# Patient Record
Sex: Male | Born: 1955
Health system: Southern US, Community
[De-identification: ages and names within clinical notes are randomized; demographics above are authoritative.]

## PROBLEM LIST (undated history)

## (undated) DIAGNOSIS — E669 Obesity, unspecified: Secondary | ICD-10-CM

## (undated) DIAGNOSIS — E1169 Type 2 diabetes mellitus with other specified complication: Secondary | ICD-10-CM

## (undated) DIAGNOSIS — E7800 Pure hypercholesterolemia, unspecified: Secondary | ICD-10-CM

## (undated) HISTORY — DX: Obesity, unspecified: E66.9

## (undated) HISTORY — DX: Pure hypercholesterolemia, unspecified: E78.00

## (undated) HISTORY — DX: Type 2 diabetes mellitus with other specified complication: E11.69

---

## 2007-08-19 ENCOUNTER — Ambulatory Visit (HOSPITAL_COMMUNITY): Payer: Self-pay | Admitting: Psychiatry

## 2007-08-23 ENCOUNTER — Ambulatory Visit (HOSPITAL_COMMUNITY): Payer: Self-pay | Admitting: Psychology

## 2007-09-03 ENCOUNTER — Ambulatory Visit (HOSPITAL_COMMUNITY): Payer: Self-pay | Admitting: Psychiatry

## 2007-09-04 ENCOUNTER — Ambulatory Visit (HOSPITAL_COMMUNITY): Payer: Self-pay | Admitting: Psychiatry

## 2007-09-18 ENCOUNTER — Ambulatory Visit (HOSPITAL_COMMUNITY): Payer: Self-pay | Admitting: Psychiatry

## 2007-10-09 ENCOUNTER — Ambulatory Visit (HOSPITAL_COMMUNITY): Payer: Self-pay | Admitting: Psychiatry

## 2007-10-15 ENCOUNTER — Ambulatory Visit (HOSPITAL_COMMUNITY): Payer: Self-pay | Admitting: Psychiatry

## 2007-10-29 ENCOUNTER — Ambulatory Visit (HOSPITAL_COMMUNITY): Payer: Self-pay | Admitting: Psychiatry

## 2007-11-12 ENCOUNTER — Ambulatory Visit (HOSPITAL_COMMUNITY): Payer: Self-pay | Admitting: Psychiatry

## 2007-11-26 ENCOUNTER — Ambulatory Visit (HOSPITAL_COMMUNITY): Payer: Self-pay | Admitting: Psychiatry

## 2007-12-17 ENCOUNTER — Ambulatory Visit (HOSPITAL_COMMUNITY): Payer: Self-pay | Admitting: Psychiatry

## 2007-12-30 ENCOUNTER — Ambulatory Visit (HOSPITAL_COMMUNITY): Payer: Self-pay | Admitting: Psychiatry

## 2008-01-13 ENCOUNTER — Ambulatory Visit (HOSPITAL_COMMUNITY): Payer: Self-pay | Admitting: Psychiatry

## 2008-01-16 ENCOUNTER — Ambulatory Visit (HOSPITAL_COMMUNITY): Payer: Self-pay | Admitting: Psychiatry

## 2008-01-28 ENCOUNTER — Ambulatory Visit (HOSPITAL_COMMUNITY): Payer: Self-pay | Admitting: Psychiatry

## 2008-02-13 ENCOUNTER — Ambulatory Visit (HOSPITAL_COMMUNITY): Payer: Self-pay | Admitting: Psychiatry

## 2008-02-26 ENCOUNTER — Ambulatory Visit (HOSPITAL_COMMUNITY): Payer: Self-pay | Admitting: Psychiatry

## 2008-03-11 ENCOUNTER — Ambulatory Visit (HOSPITAL_COMMUNITY): Payer: Self-pay | Admitting: Psychiatry

## 2008-03-25 ENCOUNTER — Ambulatory Visit (HOSPITAL_COMMUNITY): Payer: Self-pay | Admitting: Psychology

## 2008-04-08 ENCOUNTER — Ambulatory Visit (HOSPITAL_COMMUNITY): Payer: Self-pay | Admitting: Psychiatry

## 2008-04-09 ENCOUNTER — Encounter: Admission: RE | Admit: 2008-04-09 | Discharge: 2008-04-09 | Payer: Self-pay | Admitting: Sports Medicine

## 2008-04-22 ENCOUNTER — Ambulatory Visit (HOSPITAL_COMMUNITY): Payer: Self-pay | Admitting: Psychology

## 2008-05-06 ENCOUNTER — Ambulatory Visit (HOSPITAL_COMMUNITY): Payer: Self-pay | Admitting: Psychology

## 2008-05-14 ENCOUNTER — Ambulatory Visit (HOSPITAL_COMMUNITY): Payer: Self-pay | Admitting: Psychiatry

## 2008-07-15 ENCOUNTER — Ambulatory Visit (HOSPITAL_COMMUNITY): Payer: Self-pay | Admitting: Psychology

## 2008-07-29 ENCOUNTER — Ambulatory Visit (HOSPITAL_COMMUNITY): Payer: Self-pay | Admitting: Psychology

## 2008-08-25 ENCOUNTER — Ambulatory Visit (HOSPITAL_COMMUNITY): Payer: Self-pay | Admitting: Psychiatry

## 2008-09-02 ENCOUNTER — Ambulatory Visit (HOSPITAL_COMMUNITY): Payer: Self-pay | Admitting: Psychology

## 2008-09-16 ENCOUNTER — Ambulatory Visit (HOSPITAL_COMMUNITY): Payer: Self-pay | Admitting: Psychology

## 2008-09-22 ENCOUNTER — Ambulatory Visit: Payer: Self-pay | Admitting: Family Medicine

## 2008-09-22 DIAGNOSIS — M1710 Unilateral primary osteoarthritis, unspecified knee: Secondary | ICD-10-CM

## 2008-09-22 DIAGNOSIS — N200 Calculus of kidney: Secondary | ICD-10-CM

## 2008-09-22 DIAGNOSIS — K5730 Diverticulosis of large intestine without perforation or abscess without bleeding: Secondary | ICD-10-CM | POA: Insufficient documentation

## 2008-09-22 DIAGNOSIS — I10 Essential (primary) hypertension: Secondary | ICD-10-CM

## 2008-09-22 DIAGNOSIS — H00019 Hordeolum externum unspecified eye, unspecified eyelid: Secondary | ICD-10-CM | POA: Insufficient documentation

## 2008-09-22 DIAGNOSIS — E785 Hyperlipidemia, unspecified: Secondary | ICD-10-CM

## 2008-09-22 DIAGNOSIS — Z87898 Personal history of other specified conditions: Secondary | ICD-10-CM

## 2008-09-22 DIAGNOSIS — E119 Type 2 diabetes mellitus without complications: Secondary | ICD-10-CM

## 2008-09-22 DIAGNOSIS — M5137 Other intervertebral disc degeneration, lumbosacral region: Secondary | ICD-10-CM

## 2008-09-22 LAB — CONVERTED CEMR LAB: Hgb A1c MFr Bld: 7.9 %

## 2008-09-24 ENCOUNTER — Encounter: Admission: RE | Admit: 2008-09-24 | Discharge: 2008-09-24 | Payer: Self-pay | Admitting: Sports Medicine

## 2008-09-25 ENCOUNTER — Encounter: Payer: Self-pay | Admitting: Family Medicine

## 2008-09-25 LAB — CONVERTED CEMR LAB
ALT: 42 units/L
AST: 26 units/L
Albumin: 4.8 g/dL
Alkaline Phosphatase: 70 units/L
Calcium: 10.2 mg/dL
Glucose, Bld: 213 mg/dL
HCT: 44.7 %
Hemoglobin: 14.7 g/dL
PSA: 0.7 ng/mL
Platelets: 254 10*3/uL
Sodium: 134 meq/L
Total Bilirubin: 0.37 mg/dL
Triglycerides: 307 mg/dL
WBC, blood: 9.1 10*3/uL

## 2008-09-30 ENCOUNTER — Ambulatory Visit (HOSPITAL_COMMUNITY): Payer: Self-pay | Admitting: Psychology

## 2008-10-02 ENCOUNTER — Encounter: Payer: Self-pay | Admitting: Family Medicine

## 2008-10-05 ENCOUNTER — Encounter: Payer: Self-pay | Admitting: Family Medicine

## 2008-10-06 ENCOUNTER — Encounter: Admission: RE | Admit: 2008-10-06 | Discharge: 2008-11-13 | Payer: Self-pay | Admitting: Sports Medicine

## 2008-11-03 ENCOUNTER — Ambulatory Visit: Payer: Self-pay | Admitting: Family Medicine

## 2008-11-03 LAB — CONVERTED CEMR LAB: Blood Glucose, Fasting: 203 mg/dL

## 2008-11-24 ENCOUNTER — Ambulatory Visit (HOSPITAL_COMMUNITY): Payer: Self-pay | Admitting: Psychiatry

## 2008-12-29 ENCOUNTER — Telehealth (INDEPENDENT_AMBULATORY_CARE_PROVIDER_SITE_OTHER): Payer: Self-pay | Admitting: *Deleted

## 2008-12-31 ENCOUNTER — Encounter: Admission: RE | Admit: 2008-12-31 | Discharge: 2008-12-31 | Payer: Self-pay | Admitting: Sports Medicine

## 2009-02-18 ENCOUNTER — Ambulatory Visit (HOSPITAL_COMMUNITY): Payer: Self-pay | Admitting: Psychiatry

## 2009-03-11 ENCOUNTER — Ambulatory Visit (HOSPITAL_COMMUNITY): Payer: Self-pay | Admitting: Psychology

## 2009-03-27 ENCOUNTER — Encounter: Admission: RE | Admit: 2009-03-27 | Discharge: 2009-03-27 | Payer: Self-pay | Admitting: Sports Medicine

## 2009-05-27 ENCOUNTER — Ambulatory Visit (HOSPITAL_COMMUNITY): Payer: Self-pay | Admitting: Psychiatry

## 2009-08-26 ENCOUNTER — Ambulatory Visit (HOSPITAL_COMMUNITY): Payer: Self-pay | Admitting: Psychiatry

## 2010-04-07 ENCOUNTER — Ambulatory Visit (HOSPITAL_COMMUNITY): Payer: Self-pay | Admitting: Psychiatry

## 2010-07-06 ENCOUNTER — Ambulatory Visit (HOSPITAL_COMMUNITY): Payer: Self-pay | Admitting: Psychiatry

## 2010-08-19 ENCOUNTER — Encounter
Admission: RE | Admit: 2010-08-19 | Discharge: 2010-09-29 | Payer: Self-pay | Source: Home / Self Care | Admitting: Physical Medicine and Rehabilitation

## 2010-10-05 ENCOUNTER — Ambulatory Visit (HOSPITAL_COMMUNITY): Payer: Self-pay | Admitting: Psychiatry

## 2010-10-06 ENCOUNTER — Ambulatory Visit: Payer: Self-pay | Admitting: Diagnostic Radiology

## 2010-10-06 ENCOUNTER — Encounter: Payer: Self-pay | Admitting: Emergency Medicine

## 2010-10-06 ENCOUNTER — Inpatient Hospital Stay (HOSPITAL_COMMUNITY): Admission: EM | Admit: 2010-10-06 | Discharge: 2010-10-09 | Payer: Self-pay

## 2011-02-17 ENCOUNTER — Ambulatory Visit: Payer: Self-pay | Admitting: Physical Therapy

## 2011-02-21 ENCOUNTER — Ambulatory Visit: Payer: BC Managed Care – PPO | Attending: Rehabilitation | Admitting: Physical Therapy

## 2011-02-21 DIAGNOSIS — IMO0001 Reserved for inherently not codable concepts without codable children: Secondary | ICD-10-CM | POA: Insufficient documentation

## 2011-02-21 DIAGNOSIS — M542 Cervicalgia: Secondary | ICD-10-CM | POA: Insufficient documentation

## 2011-02-21 DIAGNOSIS — M25519 Pain in unspecified shoulder: Secondary | ICD-10-CM | POA: Insufficient documentation

## 2011-02-22 LAB — CBC
HCT: 36.5 % — ABNORMAL LOW (ref 39.0–52.0)
HCT: 38 % — ABNORMAL LOW (ref 39.0–52.0)
Hemoglobin: 13.4 g/dL (ref 13.0–17.0)
MCH: 29.8 pg (ref 26.0–34.0)
MCHC: 33.4 g/dL (ref 30.0–36.0)
MCHC: 35.1 g/dL (ref 30.0–36.0)
MCV: 84.3 fL (ref 78.0–100.0)
MCV: 84.7 fL (ref 78.0–100.0)
Platelets: 215 10*3/uL (ref 150–400)
RBC: 4.49 MIL/uL (ref 4.22–5.81)
RDW: 13.7 % (ref 11.5–15.5)

## 2011-02-22 LAB — DIFFERENTIAL
Basophils Relative: 0 % (ref 0–1)
Eosinophils Relative: 1 % (ref 0–5)
Lymphs Abs: 1.2 10*3/uL (ref 0.7–4.0)
Monocytes Absolute: 1.2 10*3/uL — ABNORMAL HIGH (ref 0.1–1.0)
Neutro Abs: 14.5 10*3/uL — ABNORMAL HIGH (ref 1.7–7.7)

## 2011-02-22 LAB — GLUCOSE, CAPILLARY
Glucose-Capillary: 246 mg/dL — ABNORMAL HIGH (ref 70–99)
Glucose-Capillary: 255 mg/dL — ABNORMAL HIGH (ref 70–99)

## 2011-02-22 LAB — BASIC METABOLIC PANEL
BUN: 10 mg/dL (ref 6–23)
BUN: 14 mg/dL (ref 6–23)
CO2: 28 mEq/L (ref 19–32)
CO2: 26 mEq/L (ref 19–32)
Calcium: 9.4 mg/dL (ref 8.4–10.5)
Chloride: 99 mEq/L (ref 96–112)
Chloride: 102 mEq/L (ref 96–112)
Creatinine, Ser: 0.82 mg/dL (ref 0.4–1.5)
Creatinine, Ser: 0.97 mg/dL (ref 0.4–1.5)
GFR calc Af Amer: >60 mL/min (ref >60)
GFR calc Af Amer: >60 mL/min (ref >60)
GFR calc non Af Amer: >60 mL/min (ref >60)
Glucose, Bld: 205 mg/dL — ABNORMAL HIGH (ref 70–99)
Glucose, Bld: 210 mg/dL — ABNORMAL HIGH (ref 70–99)
Potassium: 4.9 mEq/L (ref 3.5–5.1)
Sodium: 140 mEq/L (ref 135–145)

## 2011-02-22 LAB — MRSA PCR SCREENING: MRSA by PCR: NEGATIVE

## 2011-02-23 ENCOUNTER — Ambulatory Visit: Payer: BC Managed Care – PPO | Admitting: Physical Therapy

## 2011-02-27 ENCOUNTER — Ambulatory Visit: Payer: BC Managed Care – PPO | Admitting: Physical Therapy

## 2011-03-03 ENCOUNTER — Encounter: Payer: Self-pay | Admitting: Physical Therapy

## 2011-03-07 ENCOUNTER — Ambulatory Visit: Payer: BC Managed Care – PPO | Admitting: Physical Therapy

## 2011-03-10 ENCOUNTER — Ambulatory Visit: Payer: BC Managed Care – PPO | Admitting: Physical Therapy

## 2011-03-14 ENCOUNTER — Ambulatory Visit: Payer: BC Managed Care – PPO | Attending: Rehabilitation | Admitting: Physical Therapy

## 2011-03-14 DIAGNOSIS — M542 Cervicalgia: Secondary | ICD-10-CM | POA: Insufficient documentation

## 2011-03-14 DIAGNOSIS — IMO0001 Reserved for inherently not codable concepts without codable children: Secondary | ICD-10-CM | POA: Insufficient documentation

## 2011-03-14 DIAGNOSIS — M25519 Pain in unspecified shoulder: Secondary | ICD-10-CM | POA: Insufficient documentation

## 2011-03-20 ENCOUNTER — Ambulatory Visit: Payer: BC Managed Care – PPO | Admitting: Physical Therapy

## 2011-03-22 ENCOUNTER — Ambulatory Visit: Payer: BC Managed Care – PPO | Admitting: Physical Therapy

## 2011-03-27 ENCOUNTER — Encounter (INDEPENDENT_AMBULATORY_CARE_PROVIDER_SITE_OTHER): Payer: BC Managed Care – PPO | Admitting: Psychiatry

## 2011-03-27 DIAGNOSIS — F319 Bipolar disorder, unspecified: Secondary | ICD-10-CM

## 2011-08-28 ENCOUNTER — Ambulatory Visit (INDEPENDENT_AMBULATORY_CARE_PROVIDER_SITE_OTHER): Payer: BC Managed Care – PPO | Admitting: Psychology

## 2011-08-28 ENCOUNTER — Encounter (HOSPITAL_COMMUNITY): Payer: Self-pay

## 2011-08-28 DIAGNOSIS — F3160 Bipolar disorder, current episode mixed, unspecified: Secondary | ICD-10-CM

## 2011-09-11 ENCOUNTER — Encounter (INDEPENDENT_AMBULATORY_CARE_PROVIDER_SITE_OTHER): Payer: BC Managed Care – PPO | Admitting: Psychology

## 2011-09-11 DIAGNOSIS — F319 Bipolar disorder, unspecified: Secondary | ICD-10-CM

## 2011-09-21 ENCOUNTER — Encounter (INDEPENDENT_AMBULATORY_CARE_PROVIDER_SITE_OTHER): Payer: BC Managed Care – PPO | Admitting: Psychiatry

## 2011-09-21 DIAGNOSIS — F319 Bipolar disorder, unspecified: Secondary | ICD-10-CM

## 2011-09-22 ENCOUNTER — Encounter (HOSPITAL_COMMUNITY): Payer: BC Managed Care – PPO | Admitting: Psychiatry

## 2011-09-25 ENCOUNTER — Encounter (INDEPENDENT_AMBULATORY_CARE_PROVIDER_SITE_OTHER): Payer: BC Managed Care – PPO | Admitting: Psychology

## 2011-09-25 DIAGNOSIS — F319 Bipolar disorder, unspecified: Secondary | ICD-10-CM

## 2011-09-26 ENCOUNTER — Encounter (HOSPITAL_COMMUNITY): Payer: BC Managed Care – PPO | Admitting: Psychiatry

## 2011-10-23 ENCOUNTER — Other Ambulatory Visit (HOSPITAL_COMMUNITY): Payer: Self-pay | Admitting: Psychiatry

## 2011-10-23 DIAGNOSIS — F3132 Bipolar disorder, current episode depressed, moderate: Secondary | ICD-10-CM

## 2012-01-15 ENCOUNTER — Other Ambulatory Visit (HOSPITAL_COMMUNITY): Payer: Self-pay | Admitting: Psychiatry

## 2012-03-20 ENCOUNTER — Encounter (HOSPITAL_COMMUNITY): Payer: Self-pay

## 2012-03-21 ENCOUNTER — Ambulatory Visit (HOSPITAL_COMMUNITY): Payer: BC Managed Care – PPO | Admitting: Psychiatry

## 2012-04-16 ENCOUNTER — Other Ambulatory Visit (HOSPITAL_COMMUNITY): Payer: Self-pay | Admitting: Psychiatry

## 2012-04-23 ENCOUNTER — Ambulatory Visit (INDEPENDENT_AMBULATORY_CARE_PROVIDER_SITE_OTHER): Payer: BC Managed Care – PPO | Admitting: Psychiatry

## 2012-04-23 ENCOUNTER — Encounter (HOSPITAL_COMMUNITY): Payer: Self-pay | Admitting: Psychiatry

## 2012-04-23 VITALS — BP 138/82 | Ht 70.0 in | Wt 234.0 lb

## 2012-04-23 DIAGNOSIS — F314 Bipolar disorder, current episode depressed, severe, without psychotic features: Secondary | ICD-10-CM | POA: Insufficient documentation

## 2012-04-23 DIAGNOSIS — F319 Bipolar disorder, unspecified: Secondary | ICD-10-CM

## 2012-04-23 DIAGNOSIS — F3130 Bipolar disorder, current episode depressed, mild or moderate severity, unspecified: Secondary | ICD-10-CM

## 2012-04-23 NOTE — Progress Notes (Signed)
   [facility] Follow-up Outpatient Visit  Adain Geurin Vallance [birth year]   Subjective: The patient is a 56 year old male who has been followed by [facility] since September of 2008. He is currently diagnosed with bipolar disorder. I have not seen him since October of 2012. He mentioned his most recent appointment. At his last appointment, the patient has been laid off of work. He reports that he was a little depressed because of it. He's been off work since January. He has 32 weeks of severance. He is considering moving to [place]. He is jobhunting every day. He did not have his knee replacement. His primary care physician encouraged him not to. He did piroxicam for a while, but is off it now. His knee is starting to her again. The patient does report chronic pain which keeps him awake at night sometimes. He feels that his mood has been stable. There has been no manic episodes. He still has issues with his wife and her clutter. There no suicidal thoughts. No hallucinations. Patient is doing well.  Filed Vitals:   04/23/12 1428  BP: 138/82    Mental Status Examination  Appearance: Casual Alert: Yes Attention: good  Cooperative: Yes Eye Contact: Good Speech: Regular rate rhythm and volume Psychomotor Activity: Normal Memory/Concentration: Intact Oriented: person, place, time/date and situation Mood: Euthymic Affect: Appropriate Thought Processes and Associations: Logical Fund of Knowledge: Fair Thought Content: No suicidal or homicidal thoughts Insight: Fair Judgement: Fair  Diagnosis: Bipolar disorder, most recent episode depressed  Treatment Plan: The patient continues to do well on current medications. I will see him back in 6 months. Patient moved to [facility], he is to notify me. Patient call with concerns.  Jamse Mead, MD

## 2012-06-27 ENCOUNTER — Other Ambulatory Visit (HOSPITAL_COMMUNITY): Payer: Self-pay | Admitting: Psychiatry

## 2012-06-28 ENCOUNTER — Telehealth (HOSPITAL_COMMUNITY): Payer: Self-pay | Admitting: Psychiatry

## 2012-06-28 NOTE — Telephone Encounter (Signed)
Pharmacy sent in fax for request on refill of sertraline. Called pharmacy. Patient has refills of this medication.

## 2012-07-08 ENCOUNTER — Other Ambulatory Visit (HOSPITAL_COMMUNITY): Payer: Self-pay | Admitting: Psychiatry

## 2012-08-11 ENCOUNTER — Other Ambulatory Visit (HOSPITAL_COMMUNITY): Payer: Self-pay | Admitting: Psychiatry

## 2012-08-29 ENCOUNTER — Other Ambulatory Visit: Payer: Self-pay | Admitting: Obstetrics & Gynecology

## 2012-08-29 DIAGNOSIS — Z1231 Encounter for screening mammogram for malignant neoplasm of breast: Secondary | ICD-10-CM

## 2012-09-10 ENCOUNTER — Ambulatory Visit: Payer: Self-pay
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
## 2012-10-10 ENCOUNTER — Ambulatory Visit: Payer: PRIVATE HEALTH INSURANCE | Attending: Family Medicine | Admitting: Physical Therapy

## 2012-10-10 DIAGNOSIS — M25519 Pain in unspecified shoulder: Secondary | ICD-10-CM | POA: Insufficient documentation

## 2012-10-10 DIAGNOSIS — IMO0001 Reserved for inherently not codable concepts without codable children: Secondary | ICD-10-CM | POA: Insufficient documentation

## 2012-10-15 ENCOUNTER — Ambulatory Visit: Payer: PRIVATE HEALTH INSURANCE | Attending: Family Medicine | Admitting: Physical Therapy

## 2012-10-15 DIAGNOSIS — IMO0001 Reserved for inherently not codable concepts without codable children: Secondary | ICD-10-CM | POA: Insufficient documentation

## 2012-10-15 DIAGNOSIS — M25519 Pain in unspecified shoulder: Secondary | ICD-10-CM | POA: Insufficient documentation

## 2012-10-17 ENCOUNTER — Ambulatory Visit: Payer: PRIVATE HEALTH INSURANCE | Admitting: Physical Therapy

## 2012-10-21 ENCOUNTER — Ambulatory Visit: Payer: PRIVATE HEALTH INSURANCE | Admitting: Physical Therapy

## 2012-10-24 ENCOUNTER — Ambulatory Visit (INDEPENDENT_AMBULATORY_CARE_PROVIDER_SITE_OTHER): Payer: PRIVATE HEALTH INSURANCE | Admitting: Psychiatry

## 2012-10-24 ENCOUNTER — Ambulatory Visit: Payer: PRIVATE HEALTH INSURANCE | Admitting: Physical Therapy

## 2012-10-24 ENCOUNTER — Encounter (HOSPITAL_COMMUNITY): Payer: Self-pay | Admitting: Psychiatry

## 2012-10-24 VITALS — BP 120/78 | Ht 70.0 in | Wt 226.0 lb

## 2012-10-24 DIAGNOSIS — F3130 Bipolar disorder, current episode depressed, mild or moderate severity, unspecified: Secondary | ICD-10-CM

## 2012-10-24 DIAGNOSIS — F319 Bipolar disorder, unspecified: Secondary | ICD-10-CM
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
## 2012-10-29 ENCOUNTER — Ambulatory Visit: Payer: PRIVATE HEALTH INSURANCE | Admitting: Physical Therapy

## 2012-10-31 ENCOUNTER — Encounter: Payer: Self-pay | Admitting: Physical Therapy

## 2012-12-29 ENCOUNTER — Other Ambulatory Visit (HOSPITAL_COMMUNITY): Payer: Self-pay | Admitting: Psychiatry

## 2013-03-11 ENCOUNTER — Telehealth (HOSPITAL_COMMUNITY): Payer: Self-pay

## 2013-03-11 NOTE — Telephone Encounter (Signed)
Completed.

## 2013-03-11 NOTE — Telephone Encounter (Signed)
PT IS GOING BACK TO SCHOOL AND GETTING FINANCIAL ASSISTANCE THROUGH DEPARTMENT OF [employer] [facility] AND NEEDS A LETTER STATING HIS PSYCHIATRIC CONDITION, DIAGNOSIS AND TREATMENT PROGRAM, PT ALSO STATES HE WILL BE GOING TO PCP AND WANTED [place] TO SEND ORDER THERE FOR HIS LITHIUM LEVEL TO BE DRAWN [phone] FAX TO DR. BADGER

## 2013-04-02 ENCOUNTER — Other Ambulatory Visit (HOSPITAL_COMMUNITY): Payer: Self-pay | Admitting: Psychiatry

## 2013-04-23 ENCOUNTER — Ambulatory Visit (INDEPENDENT_AMBULATORY_CARE_PROVIDER_SITE_OTHER): Payer: PRIVATE HEALTH INSURANCE | Admitting: Psychiatry

## 2013-04-23 ENCOUNTER — Encounter (HOSPITAL_COMMUNITY): Payer: Self-pay | Admitting: Psychiatry

## 2013-04-23 VITALS — BP 140/90 | Ht 70.0 in | Wt 230.0 lb

## 2013-04-23 DIAGNOSIS — F319 Bipolar disorder, unspecified: Secondary | ICD-10-CM

## 2013-04-23 DIAGNOSIS — F3130 Bipolar disorder, current episode depressed, mild or moderate severity, unspecified: Secondary | ICD-10-CM

## 2013-04-23 MED ORDER — TRAZODONE HCL 50 MG PO TABS: ORAL_TABLET | ORAL | Status: DC

## 2013-04-23 NOTE — Progress Notes (Signed)
[facility] Follow-up Outpatient Visit  Caleb Barnett 1956-09-24   Subjective: The patient is a 57 year old male who has been followed by [facility] since September of 2008. He is currently diagnosed with bipolar disorder. At his last appointment, I continued his lithium, Ativan, and Zoloft. He presents today. He had called in the interim looking for a letter from vocational rehabilitation. He did not use a letter, but was denied assistance because his wife's income is too much. The patient is still employed. He feels that he could get a job in [place], but he does not want to move back here. He and his wife's issues over her hoarding continue. His wife is in denial. The patient stays busy with projects. He has been Architect. The patient did not get his lithium level. When I suggested one today, he reports he occasionally forgets his morning lithium. He also tells me he's been off Zoloft for a year. He was afraid that it would affect a life insurance. He also felt he did not need it. The patient will be starting school in the fall. He will be attending [employer] in getting a BS. 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 Because of this, he feels he needs to get his weight and blood sugars under control. He denies any racing thoughts. He is having issues sleeping at night. He does not want to go up on the Ativan, nor do I want him to. He feels that his mood has been stable.  Filed Vitals:   04/23/13 1221  BP: 140/90   Active Ambulatory Problems    Diagnosis Date Noted  . DIABETES MELLITUS, CONTROLLED 09/22/2008  . HYPERLIPIDEMIA 09/22/2008  . BIPOLAR DISORDER UNSPECIFIED 09/22/2008  . STYE 09/22/2008  . ESSENTIAL HYPERTENSION, BENIGN 09/22/2008  . DIVERTICULOSIS OF COLON 09/22/2008  . NEPHROLITHIASIS 09/22/2008  . DEGENERATIVE JOINT DISEASE, KNEES, BILATERAL 09/22/2008  . 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 BENIGN PROSTATIC HYPERTROPHY, HX  OF 09/22/2008  . Bipolar 1 disorder 04/23/2012   Resolved Ambulatory Problems    Diagnosis Date Noted  . No Resolved Ambulatory Problems   No Additional Past Medical History   Current Outpatient Prescriptions on File Prior to Visit  Medication Sig Dispense Refill  . glipiZIDE (GLUCOTROL) 5 MG tablet       . lithium 300 MG tablet take 2 and 1/2 tablets by mouth twice a day  150 tablet  5  . LORazepam (ATIVAN) 0.5 MG tablet take 1 to 2 tablets by mouth at bedtime if needed  60 tablet  5  . meloxicam (MOBIC) 15 MG tablet       . metFORMIN (GLUCOPHAGE) 850 MG tablet       . sertraline (ZOLOFT) 50 MG tablet take 1 tablet by mouth once daily  30 tablet  PRN  . simvastatin (ZOCOR) 40 MG tablet        No current facility-administered medications on file prior to visit.   Review of Systems - General ROS: negative for - sleep disturbance or weight loss Psychological ROS: negative for - irritability or mood swings Cardiovascular ROS: no chest pain or dyspnea on exertion Musculoskeletal ROS: negative for - gait disturbance or muscular weakness Neurological ROS: negative for - dizziness, headaches or seizures  Mental Status Examination  Appearance: Casual Alert: Yes Attention: good  Cooperative: Yes Eye Contact: Good Speech: Regular rate rhythm and volume Psychomotor Activity: Normal Memory/Concentration: Intact Oriented: person, place, time/date and situation Mood: Euthymic  Affect: Appropriate Thought Processes and Associations: Logical Fund of Knowledge: Fair Thought Content: No suicidal or homicidal thoughts Insight: Fair Judgement: Fair  Diagnosis: Bipolar disorder, most recent episode depressed  Treatment Plan: I will start trazodone to help with sleep. Risks, benefits, and side effects discussed. I will continue lithium and Ativan. Patient is no longer on Zoloft. I will see him back in one month. He may call with concerns. Jamse Mead, MD

## 2013-05-23 ENCOUNTER — Ambulatory Visit (INDEPENDENT_AMBULATORY_CARE_PROVIDER_SITE_OTHER): Payer: PRIVATE HEALTH INSURANCE | Admitting: Psychiatry

## 2013-05-23 ENCOUNTER — Encounter (HOSPITAL_COMMUNITY): Payer: Self-pay | Admitting: Psychiatry

## 2013-05-23 VITALS — BP 122/78 | Ht 70.0 in | Wt 222.0 lb

## 2013-05-23 DIAGNOSIS — F3130 Bipolar disorder, current episode depressed, mild or moderate severity, unspecified: Secondary | ICD-10-CM

## 2013-05-23 DIAGNOSIS — F319 Bipolar disorder, unspecified: Secondary | ICD-10-CM
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
## 2013-06-22 ENCOUNTER — Other Ambulatory Visit (HOSPITAL_COMMUNITY): Payer: Self-pay | Admitting: Psychiatry

## 2013-06-24 ENCOUNTER — Other Ambulatory Visit (HOSPITAL_COMMUNITY): Payer: Self-pay | Admitting: *Deleted

## 2013-06-24 ENCOUNTER — Telehealth (HOSPITAL_COMMUNITY): Payer: Self-pay

## 2013-06-24 MED ORDER — LORAZEPAM 0.5 MG PO TABS: ORAL_TABLET | ORAL | Status: DC

## 2013-06-27 NOTE — Telephone Encounter (Signed)
Script was called in to the pharmacy.  

## 2013-08-17 ENCOUNTER — Other Ambulatory Visit (HOSPITAL_COMMUNITY): Payer: Self-pay | Admitting: Psychiatry

## 2013-08-22 ENCOUNTER — Ambulatory Visit (INDEPENDENT_AMBULATORY_CARE_PROVIDER_SITE_OTHER): Payer: PRIVATE HEALTH INSURANCE | Admitting: Psychiatry

## 2013-08-22 ENCOUNTER — Encounter (HOSPITAL_COMMUNITY): Payer: Self-pay | Admitting: Psychiatry

## 2013-08-22 VITALS — BP 118/78 | Ht 70.0 in | Wt 218.0 lb

## 2013-08-22 DIAGNOSIS — F3130 Bipolar disorder, current episode depressed, mild or moderate severity, unspecified: Secondary | ICD-10-CM

## 2013-08-22 DIAGNOSIS — F3132 Bipolar disorder, current episode depressed, moderate: Secondary | ICD-10-CM

## 2013-08-22 NOTE — Progress Notes (Signed)
[facility] Follow-up Outpatient Visit  Caleb Barnett 1956/09/21   Subjective: The patient is a 57 year old male who has been followed by [facility] since September of 2008. He is currently diagnosed with bipolar disorder. At his last appointment, I continued lithium, Trazodone and Ativan. The patient presents today. He has started school at [employer]. It is a 4 year program. He is taking 13 hours this semester. He is only 4 weeks then. He is very disappointed with how poorly he performed when he attended college in the past. He graduated with a 2.2. He's hoping to do much better now. His wife is no longer ambivalent and is actually proud of him being in school. She does continue to work. We discussed ordering versus disorganization versus potential ADHD type symptoms. The patient is sleeping much better with the trazodone. Appetite is good. He is getting exercise. He is down 4 pounds today. Mood has been stable. There has been no manic episodes. The patient cannot remember when his last lithium level was obtained.    Filed Vitals:   08/22/13 1033  BP: 118/78   Active Ambulatory Problems    Diagnosis Date Noted  . DIABETES MELLITUS, CONTROLLED 09/22/2008  . HYPERLIPIDEMIA 09/22/2008  . BIPOLAR DISORDER UNSPECIFIED 09/22/2008  . STYE 09/22/2008  . ESSENTIAL HYPERTENSION, BENIGN 09/22/2008  . DIVERTICULOSIS OF COLON 09/22/2008  . NEPHROLITHIASIS 09/22/2008  . DEGENERATIVE JOINT DISEASE, KNEES, BILATERAL 09/22/2008  . 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 BENIGN PROSTATIC HYPERTROPHY, HX OF 09/22/2008  . Bipolar 1 disorder 04/23/2012   Resolved Ambulatory Problems    Diagnosis Date Noted  . No Resolved Ambulatory Problems   No Additional Past Medical History   Current Outpatient Prescriptions on File Prior to Visit  Medication Sig Dispense Refill  . fluticasone (FLONASE) 50 MCG/ACT nasal spray       . glipiZIDE (GLUCOTROL) 5 MG tablet        . lithium 300 MG tablet take 2 and 1/2 tablets by mouth twice a day  150 tablet  5  . LORazepam (ATIVAN) 0.5 MG tablet take 1 to 2 tablets by mouth at bedtime if needed  60 tablet  5  . losartan (COZAAR) 50 MG tablet       . meloxicam (MOBIC) 15 MG tablet       . metFORMIN (GLUCOPHAGE) 850 MG tablet       . montelukast (SINGULAIR) 10 MG tablet       . simvastatin (ZOCOR) 40 MG tablet       . traZODone (DESYREL) 50 MG tablet Take 1 to 2 at bedtime as needed  60 tablet  2   No current facility-administered medications on file prior to visit.   Review of Systems - General ROS: negative for - sleep disturbance or weight loss Psychological ROS: negative for - irritability or mood swings Cardiovascular ROS: no chest pain or dyspnea on exertion Musculoskeletal ROS: negative for - gait disturbance or muscular weakness Neurological ROS: negative for - dizziness, headaches or seizures  Mental Status Examination  Appearance: Casual Alert: Yes Attention: good  Cooperative: Yes Eye Contact: Good Speech: Regular rate rhythm and volume Psychomotor Activity: Normal Memory/Concentration: Intact Oriented: person, place, time/date and situation Mood: Euthymic Affect: Appropriate Thought Processes and Associations: Logical Fund of Knowledge: Fair Thought Content: No suicidal or homicidal thoughts Insight: Fair Judgement: Fair  Diagnosis: Bipolar disorder, most recent episode depressed  Treatment Plan: I will order a lithium level for  next week. Patient aware to get level drawn prior to taking morning medication. I will continue lithium, Ativan, and trazodone. I'll see the patient back in 6 months. Patient may call with concerns. Jamse Mead, MD

## 2013-09-12 ENCOUNTER — Telehealth (HOSPITAL_COMMUNITY): Payer: Self-pay

## 2013-09-12 NOTE — Telephone Encounter (Signed)
Returned call.  Asking for advise.  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  Told him too verbose.  Pt did report bipolar.  Tone it down for 5 weeks.

## 2013-09-15 ENCOUNTER — Ambulatory Visit (HOSPITAL_COMMUNITY): Payer: Self-pay | Admitting: Psychiatry

## 2013-10-12 ENCOUNTER — Other Ambulatory Visit (HOSPITAL_COMMUNITY): Payer: Self-pay | Admitting: Psychiatry

## 2013-11-25 ENCOUNTER — Ambulatory Visit (INDEPENDENT_AMBULATORY_CARE_PROVIDER_SITE_OTHER): Payer: PRIVATE HEALTH INSURANCE | Admitting: Psychiatry

## 2013-11-25 ENCOUNTER — Encounter (INDEPENDENT_AMBULATORY_CARE_PROVIDER_SITE_OTHER): Payer: Self-pay

## 2013-11-25 ENCOUNTER — Encounter (HOSPITAL_COMMUNITY): Payer: Self-pay | Admitting: Psychiatry

## 2013-11-25 VITALS — BP 148/91 | HR 95 | Wt 228.0 lb

## 2013-11-25 DIAGNOSIS — F314 Bipolar disorder, current episode depressed, severe, without psychotic features: Secondary | ICD-10-CM

## 2013-11-25 MED ORDER — LORAZEPAM 0.5 MG PO TABS: 0.5000 mg | ORAL_TABLET | Freq: Every evening | ORAL | Status: DC | PRN

## 2013-11-25 MED ORDER — TRAZODONE HCL 50 MG PO TABS: ORAL_TABLET | ORAL | Status: DC

## 2013-11-25 NOTE — Progress Notes (Signed)
[facility] [id number] Progress Note  Caleb Barnett [id number] 57 y.o.  11/25/2013 3:27 PM  Chief Complaint:  HPI Comments: Mr. Hollibaugh is  a 57 y/o male with a past psychiatric history significant for Bipolar I Disorder. The patient is referred for psychiatric services for psychiatric evaluation and medication management.    . Location: The patient reports he is doing relatively well.  . Quality: The patient reports he has decreased his lithium by 300 mg daily for a total of 1200 mg daily.  He denies any worsening of mood or suicidal thoughts.  In the area of affective symptoms, patient appears euthymic. Patient denies current suicidal ideation, intent, or plan. Patient denies current homicidal ideation, intent, or plan. Patient denies auditory hallucinations. Patient denies visual hallucinations. Patient denies symptoms of paranoia. Patient states sleep is poor. Appetite is good. Energy level is good. Patient denies symptoms of anhedonia. Patient denies hopelessness, helplessness, or guilt.   Denies any recent episodes consistent with mania, particularly decreased need for sleep with increased energy, grandiosity, impulsivity, hyperverbal and pressured speech, or increased productivity.   . Severity: Depression: 7/10 (0=Very depressed; 5=Neutral; 10=Very Happy)  Anxiety- 5/10 (0=no anxiety; 5= moderate/tolerable anxiety; 10= panic attacks)  . Duration: Since 1987.  . Timing: No specific timing  . Context: Class exams.  . Modifying factors: No specific factor.  . Associated signs and symptoms: Denies any  recent symptoms consistent with psychosis, particularly auditory or visual hallucinations, thought broadcasting/insertion/withdrawal, or ideas of reference. Also denies excessive worry to the point of physical symptoms as well as any panic attacks. Denies any history of trauma or symptoms consistent with PTSD such as flashbacks, nightmares, hypervigilance, feelings of  numbness or inability to connect with others.    History of Present Illness: Suicidal Ideation: Negative Plan Formed: Negative Patient has means to carry out plan: Negative  Homicidal Ideation: Negative Plan Formed: Negative Patient has means to carry out plan: Negative  Review of Systems: Psychiatric: Agitation: Yes Hallucination: No Depressed Mood: No Insomnia: Yes Hypersomnia: No Altered Concentration: No Feels Worthless: No Grandiose Ideas: No Belief In Special Powers: No New/Increased Substance Abuse: No Compulsions: No  Neurologic: Headache: No Seizure: Yes-seizure Paresthesias: No  Past Medical Family, Social History:  Family History  Problem Relation Age of Onset  . Dementia Mother   . Depression Sister   . Bipolar disorder Neg Hx   . Alcohol abuse Neg Hx   . Drug abuse Neg Hx   . Anxiety disorder Neg Hx   . Schizophrenia Neg Hx   . OCD Sister    History   Social History  . Marital Status: Married    Spouse Name: N/A    Number of Children: N/A  . Years of Education: N/A   Occupational History  . Not on file.   Social History Main Topics  . Smoking status: Never Smoker   . Smokeless tobacco: Not on file  . Alcohol Use: 0.5 oz/week    1 drink(s) per week  . Drug Use: No  . Sexual Activity: No   Other Topics Concern  . Not on file   Social History Narrative  . No narrative on file      Outpatient Encounter Prescriptions as of 11/25/2013  Medication Sig  . fluticasone (FLONASE) 50 MCG/ACT nasal spray   . glipiZIDE (GLUCOTROL) 5 MG tablet   . lithium 300 MG tablet take 2 and 1/2 tablets by mouth twice a day  . LORazepam (ATIVAN) 0.5 MG tablet  take 1 to 2 tablets by mouth at bedtime if needed  . losartan (COZAAR) 50 MG tablet   . meloxicam (MOBIC) 15 MG tablet   . metFORMIN (GLUCOPHAGE) 850 MG tablet   . montelukast (SINGULAIR) 10 MG tablet   . simvastatin (ZOCOR) 40 MG tablet   . traZODone (DESYREL) 50 MG tablet Take 1 to 2 at bedtime  as needed    Past Psychiatric History/Hospitalization(s): Anxiety: No Bipolar Disorder: Yes Depression: Yes Mania: Yes Psychosis: No Schizophrenia: No Personality Disorder: No Hospitalization for psychiatric illness: Yes History of Electroconvulsive Shock Therapy: No Prior Suicide Attempts: No  Physical Exam: Constitutional:  BP 148/91  Pulse 95  Wt 228 lb (103.42 kg)  General Appearance: alert, oriented, no acute distress and obese  Musculoskeletal: Strength & Muscle Tone: within normal limits Gait & Station: normal Patient leans: N/A  Psychiatric: General Appearance: Casual and Fairly Groomed  Patent attorney::  Good  Speech:  Clear and Coherent and Normal Rate  Volume:  Increased  Mood:  "Good"  Affect:  Appropriate, Congruent and Full Range  Thought Process:  Coherent, Linear and Logical  Orientation:  Full (Time, Place, and Person)  Thought Content:  WDL  Suicidal Thoughts:  No  Homicidal Thoughts:  No  Memory:  Immediate;   Good Recent;   Fair Remote;   Poor  Judgement:  Fair  Insight:  Fair  Psychomotor Activity:  Normal  Concentration:  Fair  Recall:  Fair  Akathisia:  No  Handed:  Right  AIMS (if indicated):  Not indicated  Assets:  Communication Skills Desire for Improvement Financial Resources/Insurance Intimacy Leisure Time Physical Health Resilience Social Support Talents/Skills Transportation  Sleep:  Number of Hours:      Assessment: Axis I: Bipolar I disorder, most recent episode (or current) depressed, severe, without mention of psychotic behavior    Plan:   Plan of Care:  PLAN:  1. Affirm with the patient that the medications are taken as ordered. Patient  expressed understanding of how their medications were to be used.    Laboratory:  No labs warranted at this time.    Psychotherapy: Therapy: brief supportive therapy provided.  Discussed psychosocial stressors in detail.    Medications:  Continue  the following  psychiatric medications as written prior to this appointment  the following changes::  a) traZODone (DESYREL) 50 MG tablet-Take 1 to 2 at bedtime as needed-Advised patient to use this medication for insomnia. b) Patient has decreased lithium 300 MG tablet-take 2 tablets by mouth twice a day c) Decrease LORazepam (ATIVAN) 0.5 MG tablet-take 1 tablets by mouth at bedtime if needed-will taper patient off this medication.  -Risks and benefits, side effects and alternatives discussed with patient, he was given an opportunity to ask questions about his medication, illness, and treatment. All current psychiatric medications have been reviewed and discussed with the patient and adjusted as clinically appropriate. The patient has been provided an accurate and updated list of the medications being now prescribed.   Routine PRN Medications:  Negative  Consultations: The patient was encouraged to keep all PCP and specialty clinic appointments.   Safety Concerns:   Patient told to call clinic if any problems occur. Patient advised to go to  ER  if he should develop SI/HI, side effects, or if symptoms worsen. Has crisis numbers to call if needed.    Other:   8. Patient was instructed to return to clinic in 6 weeks.  9. The patient was advised to call  and cancel their mental health appointment within 24 hours of the appointment, if they are unable to keep the appointment, as well as the three no show and termination from clinic policy. 10. The patient expressed understanding of the plan and agrees with the above.  Time Spent: 38 minutes Jacqulyn Cane, M.D.  11/25/2013 3:28 PM

## 2013-12-01 NOTE — Addendum Note (Signed)
Addended by: Larena Sox on: 12/01/2013 04:05 PM   Modules accepted: Level of Service

## 2014-01-06 ENCOUNTER — Ambulatory Visit (HOSPITAL_COMMUNITY): Payer: Self-pay | Admitting: Psychiatry

## 2014-01-15 ENCOUNTER — Encounter (INDEPENDENT_AMBULATORY_CARE_PROVIDER_SITE_OTHER): Payer: Self-pay

## 2014-01-15 ENCOUNTER — Ambulatory Visit (INDEPENDENT_AMBULATORY_CARE_PROVIDER_SITE_OTHER): Payer: PRIVATE HEALTH INSURANCE | Admitting: Psychiatry

## 2014-01-15 ENCOUNTER — Encounter (HOSPITAL_COMMUNITY): Payer: Self-pay | Admitting: Psychiatry

## 2014-01-15 VITALS — BP 132/77 | HR 89 | Wt 228.0 lb

## 2014-01-15 DIAGNOSIS — F314 Bipolar disorder, current episode depressed, severe, without psychotic features: Secondary | ICD-10-CM

## 2014-01-15 MED ORDER — SERTRALINE HCL 50 MG PO TABS: 50.0000 mg | ORAL_TABLET | Freq: Every day | ORAL | Status: AC

## 2014-01-15 MED ORDER — LITHIUM CARBONATE 300 MG PO TABS: ORAL_TABLET | ORAL | Status: DC

## 2014-01-15 MED ORDER — TRAZODONE HCL 50 MG PO TABS: ORAL_TABLET | ORAL | Status: DC

## 2014-01-15 NOTE — Progress Notes (Signed)
[facility] Follow-up Outpatient Visit  Caleb Barnett 08-04-1956 01/15/2014 2:45 PM  Chief Complaint:  HPI Comments: Mr. Caleb Barnett is  a 58 y/o male with a past psychiatric history significant for Bipolar I Disorder. The patient is referred for psychiatric services for  medication management.    . Location: The patient reports he has been doing well with trazodone and a decreased dose of benzodiazepines. . Quality: The patient reports he is still taking lithium by 300 mg daily for a total of 1200 mg daily.  He denies any worsening of mood or suicidal thoughts. He reports he is taking his medications and denies and side effects. .  In the area of affective symptoms, patient appears euthymic. Patient denies current suicidal ideation, intent, or plan. Patient denies current homicidal ideation, intent, or plan. Patient denies auditory hallucinations. Patient denies visual hallucinations. Patient denies symptoms of paranoia. Patient states sleep improved with a total of . Appetite is increased. Energy level is good. Patient denies symptoms of anhedonia. Patient denies hopelessness, helplessness, or guilt.   Denies any recent episodes consistent with mania, particularly decreased need for sleep with increased energy, grandiosity, impulsivity, hyperverbal and pressured speech, or increased productivity.   . Severity: Depression: 5/10 (0=Very depressed; 5=Neutral; 10=Very Happy)  Anxiety- 8/10 (0=no anxiety; 5= moderate/tolerable anxiety; 10= panic attacks)-for class exams.  . Duration: Since 1987.  . Timing: No specific timing  . Context: Class exams.  . Modifying factors: No reported modifying factors.  . Associated signs and symptoms: Denies any  recent symptoms consistent with psychosis, particularly auditory or visual hallucinations, thought broadcasting/insertion/withdrawal, or ideas of reference. Also denies excessive worry to the point of physical symptoms as well as  any panic attacks. Denies any history of trauma or symptoms consistent with PTSD such as flashbacks, nightmares, hypervigilance, feelings of numbness or inability to connect with others.    History of Present Illness: Suicidal Ideation: Negative Plan Formed: Negative Patient has means to carry out plan: Negative  Homicidal Ideation: Negative Plan Formed: Negative Patient has means to carry out plan: Negative  Review of Systems: Psychiatric: Agitation: Yes Hallucination: No Depressed Mood: No Insomnia: Yes Hypersomnia: No Altered Concentration: No Feels Worthless: No Grandiose Ideas: No Belief In Special Powers: No New/Increased Substance Abuse: No Compulsions: No  Neurologic: Headache: No Seizure: Yes-seizure Paresthesias: No  Past Medical Family, Social History:  Family History  Problem Relation Age of Onset  . Dementia Mother   . Depression Sister   . Bipolar disorder Neg Hx   . Alcohol abuse Neg Hx   . Drug abuse Neg Hx   . Anxiety disorder Neg Hx   . Schizophrenia Neg Hx   . OCD Sister    History   Social History  . Marital Status: Married    Spouse Name: N/A    Number of Children: N/A  . Years of Education: N/A   Occupational History  . Not on file.   Social History Main Topics  . Smoking status: Never Smoker   . Smokeless tobacco: Not on file  . Alcohol Use: 0.5 oz/week    1 drink(s) per week  . Drug Use: No  . Sexual Activity: No   Other Topics Concern  . Not on file   Social History Narrative  . No narrative on file      Outpatient Encounter Prescriptions as of 01/15/2014  Medication Sig  . dimenhyDRINATE (DRAMAMINE) 50 MG tablet Take 50 mg by mouth every 8 (eight) hours as  needed.  . fluticasone (FLONASE) 50 MCG/ACT nasal spray   . glipiZIDE (GLUCOTROL) 5 MG tablet   . lithium 300 MG tablet take 2  tablets by mouth twice a day  . LORazepam (ATIVAN) 0.5 MG tablet Take 1 tablet (0.5 mg total) by mouth at bedtime as needed for anxiety.  Marland Kitchen  losartan (COZAAR) 50 MG tablet   . meloxicam (MOBIC) 15 MG tablet   . metFORMIN (GLUCOPHAGE) 850 MG tablet   . montelukast (SINGULAIR) 10 MG tablet   . simvastatin (ZOCOR) 40 MG tablet   . traZODone (DESYREL) 50 MG tablet Take 1 to 2 at bedtime as needed    Past Psychiatric History/Hospitalization(s): Anxiety: No Bipolar Disorder: Yes Depression: Yes Mania: Yes Psychosis: No Schizophrenia: No Personality Disorder: No Hospitalization for psychiatric illness: Yes History of Electroconvulsive Shock Therapy: No Prior Suicide Attempts: No  Physical Exam: Constitutional:  BP 132/77  Pulse 89  Wt 228 lb (103.42 kg)  General Appearance: alert, oriented, no acute distress and obese  Musculoskeletal: Strength & Muscle Tone: within normal limits Gait & Station: normal Patient leans: N/A  Psychiatric: General Appearance: Casual and Fairly Groomed  Patent attorney::  Good  Speech:  Clear and Coherent and Normal Rate  Volume:  Increased  Mood:  "Good"  Affect:  Appropriate, Congruent and Full Range  Thought Process:  Coherent, Linear and Logical  Orientation:  Full (Time, Place, and Person)  Thought Content:  WDL  Suicidal Thoughts:  No  Homicidal Thoughts:  No  Memory:  Immediate;   Good Recent;   Fair Remote;   Poor  Judgement:  Fair  Insight:  Fair  Psychomotor Activity:  Normal  Concentration:  Fair  Recall:  Fair  Akathisia:  No  Handed:  Right  AIMS (if indicated):  Not indicated  Assets:  Communication Skills Desire for Improvement Financial Resources/Insurance Intimacy Leisure Time Physical Health Resilience Social Support Talents/Skills Transportation  Sleep:  Number of Hours:      Assessment: Bipolar I disorder, most recent episode (or current) depressed, severe, without mention of psychotic behavior-stable Axis I: Bipolar I disorder, most recent episode (or current) depressed, severe, without mention of psychotic behavior    Plan:   Plan of Care:   PLAN:  1. Affirm with the patient that the medications are taken as ordered. Patient  expressed understanding of how their medications were to be used.    Laboratory:  No labs warranted at this time.  Will order lithium level at next visit.  Psychotherapy: Therapy: brief supportive therapy provided.  Discussed psychosocial stressors in detail.  More than 50% of the visit was spent on individual therapy/counseling.   Medications:  Continue  the following psychiatric medications as written prior to this appointment  the following changes: a) traZODone (DESYREL) 50 MG tablet-Take 1 to 2 at bedtime as needed-Advised patient to use this medication for insomnia. b) Lithium 300 MG tablet-take 2 tablets by mouth twice a day-no change at this time. C) Decrease LORazepam (ATIVAN) 0.5 MG tablet-take 1 tablets by mouth at bedtime if needed-will taper patient off this medication. D) Sertraline-50 mg -Risks and benefits, side effects and alternatives discussed with patient, he was given an opportunity to ask questions about his medication, illness, and treatment. All current psychiatric medications have been reviewed and discussed with the patient and adjusted as clinically appropriate. The patient has been provided an accurate and updated list of the medications being now prescribed.   Routine PRN Medications:  Negative  Consultations: The  patient was encouraged to keep all PCP and specialty clinic appointments.   Safety Concerns:   Patient told to call clinic if any problems occur. Patient advised to go to  ER  if he should develop SI/HI, side effects, or if symptoms worsen. Has crisis numbers to call if needed.    Other:   8. Patient was instructed to return to clinic in 6 weeks.  9. The patient was advised to call and cancel their mental health appointment within 24 hours of the appointment, if they are unable to keep the appointment, as well as the three no show and termination from clinic policy. 10.  The patient expressed understanding of the plan and agrees with the above.  Time Spent: 38 minutes Jacqulyn Cane, M.D.  01/15/2014 2:45 PM

## 2014-02-19 ENCOUNTER — Ambulatory Visit (HOSPITAL_COMMUNITY): Payer: Self-pay | Admitting: Psychiatry

## 2014-02-26 ENCOUNTER — Ambulatory Visit (HOSPITAL_COMMUNITY): Payer: Self-pay | Admitting: Psychiatry

## 2014-03-27 ENCOUNTER — Other Ambulatory Visit (HOSPITAL_COMMUNITY): Payer: Self-pay | Admitting: Psychiatry

## 2014-03-27 DIAGNOSIS — F314 Bipolar disorder, current episode depressed, severe, without psychotic features: Secondary | ICD-10-CM

## 2014-04-01 MED ORDER — LITHIUM CARBONATE 300 MG PO TABS: ORAL_TABLET | ORAL | Status: AC

## 2014-04-28 ENCOUNTER — Other Ambulatory Visit (HOSPITAL_COMMUNITY): Payer: Self-pay | Admitting: Psychiatry

## 2014-05-01 ENCOUNTER — Ambulatory Visit (HOSPITAL_COMMUNITY): Payer: Self-pay | Admitting: Psychiatry

## 2014-05-07 ENCOUNTER — Telehealth (HOSPITAL_COMMUNITY): Payer: Self-pay | Admitting: *Deleted

## 2014-05-07 NOTE — Telephone Encounter (Signed)
Received faxed request for Trazodone 50 mg.Last appt 01/15/14 with Dr. Laury Deep.No show 02/26/14 with Dr. Laury Deep and No Show 5/22 with Dr. Alta Corning.Appt 05/19/14 with Dr. Alta Corning

## 2014-05-07 NOTE — Telephone Encounter (Signed)
The trazodone cannot be refill as the patient has had multiple no-shows. Patient needs to see Dr Lynnae Sandhoff in order to get prescription. Let patient know

## 2014-05-19 ENCOUNTER — Encounter (HOSPITAL_COMMUNITY): Payer: Self-pay | Admitting: Psychiatry

## 2014-05-19 ENCOUNTER — Ambulatory Visit (INDEPENDENT_AMBULATORY_CARE_PROVIDER_SITE_OTHER): Payer: PRIVATE HEALTH INSURANCE | Admitting: Psychiatry

## 2014-05-19 ENCOUNTER — Encounter (INDEPENDENT_AMBULATORY_CARE_PROVIDER_SITE_OTHER): Payer: Self-pay

## 2014-05-19 DIAGNOSIS — F314 Bipolar disorder, current episode depressed, severe, without psychotic features: Secondary | ICD-10-CM

## 2014-05-19 MED ORDER — TRAZODONE HCL 50 MG PO TABS: ORAL_TABLET | ORAL | Status: AC

## 2014-05-19 MED ORDER — LORAZEPAM 0.5 MG PO TABS: 0.5000 mg | ORAL_TABLET | Freq: Every evening | ORAL | Status: AC | PRN

## 2014-05-19 NOTE — Progress Notes (Signed)
Patient ID: Caleb Barnett, male   DOB: 1956/01/09, 58 y.o.   MRN: [id number]   [facility] Follow-up Outpatient Visit  Caleb Barnett 05-17-1956 05/19/2014 1:46 PM  Chief Complaint:  HPI Comments: Caleb Barnett is  a 58 y/o male with a past psychiatric history significant for Bipolar I Disorder. The patient is referred for psychiatric services for  medication management.    . Location: The patient reports he had been doing well with trazodone and a decreased dose of benzodiazepines. But apparently ran low on trazadone. Wants to follow with Dr. Christell Constant and had to come one time to get trazadone. As his sleep is disturbed, increase agitation, anxiety and some racy toughts. Concern he may go into a hypomanic episode without adequate sleep. Stopped zoloft said it wasn't of much help. . Quality: The patient reports he is still taking lithium by 300 mg daily for a total of 1200 mg daily.  He denies any worsening of mood or suicidal thoughts. He reports no side effects. .  In the area of affective symptoms, patient appears euthymic. Patient denies current suicidal ideation, intent, or plan. Patient denies current homicidal ideation, intent, or plan. Patient denies auditory hallucinations. Patient denies visual hallucinations. Patient denies symptoms of paranoia. Patient states sleep improved with a total of . Appetite is increased. Energy level is good. Patient denies symptoms of anhedonia. Patient denies hopelessness, helplessness, or guilt.   Denies any recent episodes consistent with mania, particularly decreased need for sleep with increased energy, grandiosity, impulsivity, hyperverbal and pressured speech, or increased productivity.   . Severity: Depression: 5/10 (0=Very depressed; 5=Neutral; 10=Very Happy)  Anxiety- 8/10 (0=no anxiety; 5= moderate/tolerable anxiety; 10= panic attacks)-for class exams.  . Duration: Since 1987.  . Timing: No specific timing  . Context: Class  exams.  . Modifying factors: No reported modifying factors.  . Associated signs and symptoms: Denies any  recent symptoms consistent with psychosis, particularly auditory or visual hallucinations, thought broadcasting/insertion/withdrawal, or ideas of reference. Also denies excessive worry to the point of physical symptoms as well as any panic attacks. Denies any history of trauma or symptoms consistent with PTSD such as flashbacks, nightmares, hypervigilance, feelings of numbness or inability to connect with others.    History of Present Illness: Suicidal Ideation: Negative Plan Formed: Negative Patient has means to carry out plan: Negative  Homicidal Ideation: Negative Plan Formed: Negative Patient has means to carry out plan: Negative  Review of Systems: Psychiatric: Agitation: Yes Hallucination: No Depressed Mood: No Insomnia: Yes Hypersomnia: No Altered Concentration: No Feels Worthless: No Grandiose Ideas: No Belief In Special Powers: No New/Increased Substance Abuse: No Compulsions: No  Neurologic: Headache: No Seizure: Yes-seizure Paresthesias: No  Past Medical Family, Social History:  Family History  Problem Relation Age of Onset  . Dementia Mother   . Depression Sister   . Bipolar disorder Neg Hx   . Alcohol abuse Neg Hx   . Drug abuse Neg Hx   . Anxiety disorder Neg Hx   . Schizophrenia Neg Hx   . OCD Sister    History   Social History  . Marital Status: Married    Spouse Name: N/A    Number of Children: N/A  . Years of Education: N/A   Occupational History  . Not on file.   Social History Main Topics  . Smoking status: Never Smoker   . Smokeless tobacco: Not on file  . Alcohol Use: 0.5 oz/week    1 drink(s) per week  .  Drug Use: No  . Sexual Activity: No   Other Topics Concern  . Not on file   Social History Narrative  . No narrative on file      Outpatient Encounter Prescriptions as of 05/19/2014  Medication Sig  . dimenhyDRINATE  (DRAMAMINE) 50 MG tablet Take 50 mg by mouth every 8 (eight) hours as needed.  Marland Kitchen. glipiZIDE (GLUCOTROL) 5 MG tablet   . lithium 300 MG tablet take 2  tablets by mouth twice a day  . LORazepam (ATIVAN) 0.5 MG tablet Take 1 tablet (0.5 mg total) by mouth at bedtime as needed.  Marland Kitchen. losartan (COZAAR) 50 MG tablet   . meloxicam (MOBIC) 15 MG tablet   . metFORMIN (GLUCOPHAGE) 850 MG tablet   . montelukast (SINGULAIR) 10 MG tablet   . sertraline (ZOLOFT) 50 MG tablet Take 1 tablet (50 mg total) by mouth daily.  . simvastatin (ZOCOR) 40 MG tablet   . traZODone (DESYREL) 50 MG tablet Take 1 to 2 at bedtime as needed  . [DISCONTINUED] LORazepam (ATIVAN) 0.5 MG tablet Take 1 tablet (0.5 mg total) by mouth at bedtime as needed.  . [DISCONTINUED] traZODone (DESYREL) 50 MG tablet Take 1 to 2 at bedtime as needed    Past Psychiatric History/Hospitalization(s): Anxiety: No Bipolar Disorder: Yes Depression: Yes Mania: Yes Psychosis: No Schizophrenia: No Personality Disorder: No Hospitalization for psychiatric illness: Yes History of Electroconvulsive Shock Therapy: No Prior Suicide Attempts: No  Physical Exam: Constitutional:  There were no vitals taken for this visit.  General Appearance: alert, oriented, no acute distress and obese  Musculoskeletal: Strength & Muscle Tone: within normal limits Gait & Station: normal Patient leans: N/A  Psychiatric: General Appearance: Casual and Fairly Groomed  Patent attorneyye Contact::  Good  Speech:  Clear and Coherent and Normal Rate with some circumstantiality  Volume:  Increased  Mood:  "Good"  Affect:  Appropriate, Congruent and Full Range  Thought Process:  Coherent, Linear and Logical  Orientation:  Full (Time, Place, and Person)  Thought Content:  WDL  Suicidal Thoughts:  No  Homicidal Thoughts:  No  Memory:  Immediate;   Good Recent;   Fair Remote;   Poor  Judgement:  Fair  Insight:  Fair  Psychomotor Activity:  Normal  Concentration:  Fair   Recall:  Fair  Akathisia:  No  Handed:  Right  AIMS (if indicated):  Not indicated  Assets:  Communication Skills Desire for Improvement Financial Resources/Insurance Intimacy Leisure Time Physical Health Resilience Social Support Talents/Skills Transportation  Sleep:  Number of Hours:      Assessment: Bipolar I disorder, most recent episode (or current) depressed, severe, without mention of psychotic behavior-stable Axis I: Bipolar I disorder, most recent episode (or current) depressed, severe, without mention of psychotic behavior    Plan:   Plan of Care:  PLAN:  1. Affirm with the patient that the medications are taken as ordered. Patient  expressed understanding of how their medications were to be used.    Laboratory:  No labs warranted at this time.  Will order lithium level at next visit.  Psychotherapy: Therapy: brief supportive therapy provided.  Discussed psychosocial stressors in detail.  More than 50% of the visit was spent on individual therapy/counseling.   Medications:  Continue  the following psychiatric medications as written prior to this appointment  the following changes: a) traZODone (DESYREL) 50 MG tablet-Take 1 to 2 at bedtime as needed- to prevent insomnia as hypomania trigger. b) Lithium 300 MG tablet-take 2  tablets by mouth twice a day-no change at this time. C) LORazepam (ATIVAN) 0.5 MG tablet-take 1 tablets by mouth at bedtime as he is feeling anxious without it and combined with trazadone has been a good combination. He was on higher dose before but showed understanding to lower the dose. D) stop Sertraline-50 mg. Already not taking -Risks and benefits, side effects and alternatives discussed with patient, he was given an opportunity to ask questions about his medication, illness, and treatment. All current psychiatric medications have been reviewed and discussed with the patient and adjusted as clinically appropriate. The patient has been  provided an accurate and updated list of the medications being now prescribed.   Routine PRN Medications:  Negative  Consultations: The patient was encouraged to keep all PCP and specialty clinic appointments.   Safety Concerns:   Patient told to call clinic if any problems occur. Patient advised to go to  ER  if he should develop SI/HI, side effects, or if symptoms worsen. Has crisis numbers to call if needed.    Other:   8. Patient was given option to return clinic. He was very humble and thankful but said he has already made appointment with Dr. Christell Constant whom he had seen in the past many years.  9. The patient was advised to call and cancel their mental health appointment within 24 hours of the appointment, if they are unable to keep the appointment, as well as the three no show and termination from clinic policy. 10. The patient expressed understanding of the plan and agrees with the above.  Time Spent: 38 minutes Caleb Barnett, M.D.  05/19/2014 1:46 PM

## 2014-12-01 ENCOUNTER — Other Ambulatory Visit (HOSPITAL_COMMUNITY): Payer: Self-pay | Admitting: Psychiatry

## 2014-12-08 ENCOUNTER — Telehealth (HOSPITAL_COMMUNITY): Payer: Self-pay | Admitting: *Deleted

## 2014-12-08 NOTE — Telephone Encounter (Signed)
lvm for pt to return call to office to schedule appt w/ Dr. Gilmore LarocheAkhtar. Pt was last seen on 05/19/2014.

## 2015-01-02 ENCOUNTER — Other Ambulatory Visit (HOSPITAL_COMMUNITY): Payer: Self-pay | Admitting: Psychiatry

## 2018-07-01 ENCOUNTER — Ambulatory Visit: Payer: Self-pay | Admitting: Physical Therapy

## 2022-05-19 LAB — COLOGUARD: COLOGUARD: NEGATIVE

## 2023-04-27 ENCOUNTER — Encounter: Payer: Self-pay | Admitting: Podiatry

## 2023-04-27 ENCOUNTER — Ambulatory Visit (INDEPENDENT_AMBULATORY_CARE_PROVIDER_SITE_OTHER): Payer: Medicare (Managed Care) | Admitting: Podiatry

## 2023-04-27 DIAGNOSIS — E119 Type 2 diabetes mellitus without complications: Secondary | ICD-10-CM

## 2023-04-27 DIAGNOSIS — M79675 Pain in left toe(s): Secondary | ICD-10-CM

## 2023-04-27 DIAGNOSIS — M79674 Pain in right toe(s): Secondary | ICD-10-CM

## 2023-04-27 DIAGNOSIS — B351 Tinea unguium: Secondary | ICD-10-CM

## 2023-04-27 NOTE — Progress Notes (Signed)
  Subjective:  Patient ID: Caleb Barnett, male    DOB: [birth year],   MRN: [id number]  Chief Complaint  Patient presents with   Nail Problem    Left great toenail    67 y.o. male presents for concern of left great toenail. Relates years ago he was in an accident and lost his toenail. Since then it has grown back thickened and discolored. He has been trying to sand it down with a dremel. concern of thickened elongated and painful nails that are difficult to trim. Requesting to have them trimmed today. Denies  burning and tingling in their feet. Patient is diabetic and last A1c was 9.   PCP:  Default, Provider, MD     PCP:  Default, Provider, MD    Denies any other pedal complaints. Denies n/v/f/c.   History reviewed. No pertinent past medical history.  Objective:  Physical Exam: Vascular: DP/PT pulses 2/4 bilateral. CFT <3 seconds. Absent hair growth on digits. Edema noted to bilateral lower extremities. Xerosis noted bilaterally.  Skin. No lacerations or abrasions bilateral feet. Nails 1-5 bilateral  are thickened discolored and elongated with subungual debris.  Musculoskeletal: MMT 5/5 bilateral lower extremities in DF, PF, Inversion and Eversion. Deceased ROM in DF of ankle joint.  Neurological: Sensation intact to light touch. Protective sensation intact bilateral.    Assessment:   1. Pain due to onychomycosis of toenails of both feet   2. Type 2 diabetes mellitus without complication, unspecified whether long term insulin use (HCC)      Plan:  Patient was evaluated and treated and all questions answered. -Discussed and educated patient on diabetic foot care, especially with  regards to the vascular, neurological and musculoskeletal systems.  -Stressed the importance of good glycemic control and the detriment of not  controlling glucose levels in relation to the foot. -Discussed supportive shoes at all times and checking feet regularly.  -Mechanically debrided all  nails 1-5 bilateral using sterile nail nipper and filed with dremel without incident  -Answered all patient questions -Patient to return  in 3 months for at risk foot care -Patient advised to call the office if any problems or questions arise in the meantime.   Louann Sjogren, DPM

## 2023-07-27 ENCOUNTER — Ambulatory Visit (INDEPENDENT_AMBULATORY_CARE_PROVIDER_SITE_OTHER): Payer: Medicare (Managed Care) | Admitting: Podiatry

## 2023-07-27 DIAGNOSIS — Z91199 Patient's noncompliance with other medical treatment and regimen due to unspecified reason: Secondary | ICD-10-CM

## 2023-07-27 NOTE — Progress Notes (Signed)
No show
